# Patient Record
Sex: Male | Born: 1990 | Race: Black or African American | Hispanic: No | Marital: Single | Smoking: Never smoker
Health system: Southern US, Community
[De-identification: ages and names within clinical notes are randomized; demographics above are authoritative.]

---

## 2016-09-23 ENCOUNTER — Emergency Department (HOSPITAL_COMMUNITY)
Admission: EM | Admit: 2016-09-23 | Discharge: 2016-09-23 | Disposition: A | Discharge status: Home / Self Care | Payer: Self-pay | Attending: Emergency Medicine | Admitting: Emergency Medicine

## 2016-09-23 ENCOUNTER — Encounter (HOSPITAL_COMMUNITY): Payer: Self-pay | Admitting: *Deleted

## 2016-09-23 ENCOUNTER — Emergency Department (HOSPITAL_COMMUNITY): Payer: Self-pay

## 2016-09-23 DIAGNOSIS — S6991XA Unspecified injury of right wrist, hand and finger(s), initial encounter: Secondary | ICD-10-CM | POA: Insufficient documentation

## 2016-09-23 DIAGNOSIS — W231XXA Caught, crushed, jammed, or pinched between stationary objects, initial encounter: Secondary | ICD-10-CM | POA: Insufficient documentation

## 2016-09-23 DIAGNOSIS — Y939 Activity, unspecified: Secondary | ICD-10-CM | POA: Insufficient documentation

## 2016-09-23 DIAGNOSIS — Y999 Unspecified external cause status: Secondary | ICD-10-CM | POA: Insufficient documentation

## 2016-09-23 DIAGNOSIS — Y929 Unspecified place or not applicable: Secondary | ICD-10-CM | POA: Insufficient documentation

## 2016-09-23 MED ORDER — IBUPROFEN 400 MG PO TABS
600.0000 mg | ORAL_TABLET | Freq: Once | ORAL | Status: AC
  Administered 2016-09-23: 15:00:00 600 mg via ORAL
  Filled 2016-09-23: qty 1

## 2016-09-23 MED ORDER — IBUPROFEN 600 MG PO TABS: 600.0000 mg | ORAL_TABLET | Freq: Four times a day (QID) | ORAL | 0 refills | Status: AC | PRN

## 2016-09-23 NOTE — ED Notes (Signed)
Gone to xray  

## 2016-09-23 NOTE — ED Triage Notes (Signed)
Pt reports slamming right middle finger in car door on Monday and still having pain.

## 2016-09-23 NOTE — ED Notes (Signed)
rETURNED FROM XRAY.

## 2016-09-23 NOTE — ED Provider Notes (Signed)
[facility]-EMERGENCY DEPT Provider Note    By signing my name below, I, Earmon Phoenix, attest that this documentation has been prepared under the direction and in the presence of Melburn Hake, PA-C. Electronically Signed: Earmon Phoenix, ED Scribe. 09/23/16. 3:36 PM.    History   Chief Complaint Chief Complaint  Patient presents with  . Finger Injury    The history is provided by the patient and medical records. No language interpreter was used.    Francisco Sims is a 26 y.o. male who presents to the Emergency Department complaining of a right middle finger injury that occurred three days ago. He reports associated swelling that has been improving. He states he accidentally slammed the distal tip of hid middle finger in the car door. He has taken Tylenol and Advil for pain with temporary relief. Moving or bending the finger increases the pain. He denies alleviating factors. He denies numbness, tingling or weakness of the right third digit or right hand, fever, chills, right wrist or hand pain. He denies any previous injury or surgery to that finger.   History reviewed. No pertinent past medical history.  There are no active problems to display for this patient.   History reviewed. No pertinent surgical history.     Home Medications    Prior to Admission medications   Medication Sig Start Date End Date Taking? Authorizing Provider  ibuprofen (ADVIL,MOTRIN) 600 MG tablet Take 1 tablet (600 mg total) by mouth every 6 (six) hours as needed. 09/23/16   Barrett Henle, PA-C    Family History History reviewed. No pertinent family history.  Social History Social History  Substance Use Topics  . Smoking status: Never Smoker  . Smokeless tobacco: Not on file  . Alcohol use Yes     Comment: occ     Allergies   Patient has no known allergies.   Review of Systems Review of Systems  Musculoskeletal: Positive for arthralgias and joint swelling.  Neurological:  Negative for weakness and numbness.     Physical Exam Updated Vital Signs BP (!) 153/96 (BP Location: Right Arm)   Pulse 77   Temp 98.1 F (36.7 C) (Oral)   Resp 18   SpO2 100%   Physical Exam  Constitutional: He is oriented to person, place, and time. He appears well-developed and well-nourished.  HENT:  Head: Normocephalic and atraumatic.  Eyes: Conjunctivae and EOM are normal. Right eye exhibits no discharge. Left eye exhibits no discharge. No scleral icterus.  Neck: Normal range of motion. Neck supple.  Cardiovascular: Normal rate and intact distal pulses.   Pulmonary/Chest: Effort normal.  Musculoskeletal: He exhibits tenderness. He exhibits no deformity.  Tenderness to palpation over right middle finger with decreased active ROM of the  MCP, PIP and DIP joints; full passive ROM. Small abrasion present to volar aspect of right distal phalanx near nail bed. Nail grossly intact. Sensation grossly intact. Full ROM of remaining digits and right wrist and forearm. Radial pulse 2+.  Neurological: He is alert and oriented to person, place, and time.  Skin: Skin is warm and dry. Capillary refill takes less than 2 seconds.  Nursing note and vitals reviewed.    ED Treatments / Results  DIAGNOSTIC STUDIES: Oxygen Saturation is 100% on RA, normal by my interpretation.   COORDINATION OF CARE: 2:37 PM- Will X-Ray right hand. Will order Ibuprofen and ice pack prior to imaging. Pt verbalizes understanding and agrees to plan.  Medications  ibuprofen (ADVIL,MOTRIN) tablet 600 mg (600 mg  Oral Given 09/23/16 1525)    Labs (all labs ordered are listed, but only abnormal results are displayed) Labs Reviewed - No data to display  EKG  EKG Interpretation None       Radiology Dg Hand Complete Right  Result Date: 09/23/2016 CLINICAL DATA:  Slammed right middle finger in car door x Monday, DIP swelling, pt shielded EXAM: RIGHT HAND - COMPLETE 3+ VIEW COMPARISON:  None. FINDINGS: No  fracture or dislocation of the hand. Particular attention directed to third digit. IMPRESSION: No fracture or dislocation. Electronically Signed   By: Genevive Bi M.D.   On: 09/23/2016 15:31    Procedures Procedures (including critical care time)  Medications Ordered in ED Medications  ibuprofen (ADVIL,MOTRIN) tablet 600 mg (600 mg Oral Given 09/23/16 1525)     Initial Impression / Assessment and Plan / ED Course  I have reviewed the triage vital signs and the nursing notes.  Pertinent labs & imaging results that were available during my care of the patient were reviewed by me and considered in my medical decision making (see chart for details).     Patient X-Ray negative for obvious fracture or dislocation. Pain managed in ED. Suspect finger contusion vs finger sprain. Pt advised to follow up with hand surgeon if symptoms persist. Patient advised to continue wearing finger brace for the next week, conservative therapy recommended and discussed. Patient will be dc home & is agreeable with above plan.   Final Clinical Impressions(s) / ED Diagnoses   Final diagnoses:  Injury of finger of right hand, initial encounter    New Prescriptions New Prescriptions   IBUPROFEN (ADVIL,MOTRIN) 600 MG TABLET    Take 1 tablet (600 mg total) by mouth every 6 (six) hours as needed.    I personally performed the services described in this documentation, which was scribed in my presence. The recorded information has been reviewed and is accurate.     Satira Sark [place], [place] 09/23/16 1546    Linwood Dibbles, MD 09/23/16 2105

## 2016-09-23 NOTE — Discharge Instructions (Signed)
Take your medication as prescribed as needed for pain and swelling. I also recommend continuing to rest, elevate and apply ice to affected finger for 15-20 minutes 3-4 times daily. He may continue wearing your finger splint daily. I recommend following up with the hand clinic listed below if your symptoms are not improved over the next week. Please return to the Emergency Department if symptoms worsen or new onset of fever, redness, swelling, numbness, weakness, decreased range of motion.

## 2017-11-15 IMAGING — DX DG HAND COMPLETE 3+V*R*
3 series · 3 of 3 positions shown · non-contrast
Comparison: None.

CLINICAL DATA: Slammed right middle finger in car door x [REDACTED],
DIP swelling, pt shielded

EXAM:
RIGHT HAND - COMPLETE 3+ VIEW

[x hand pa right]
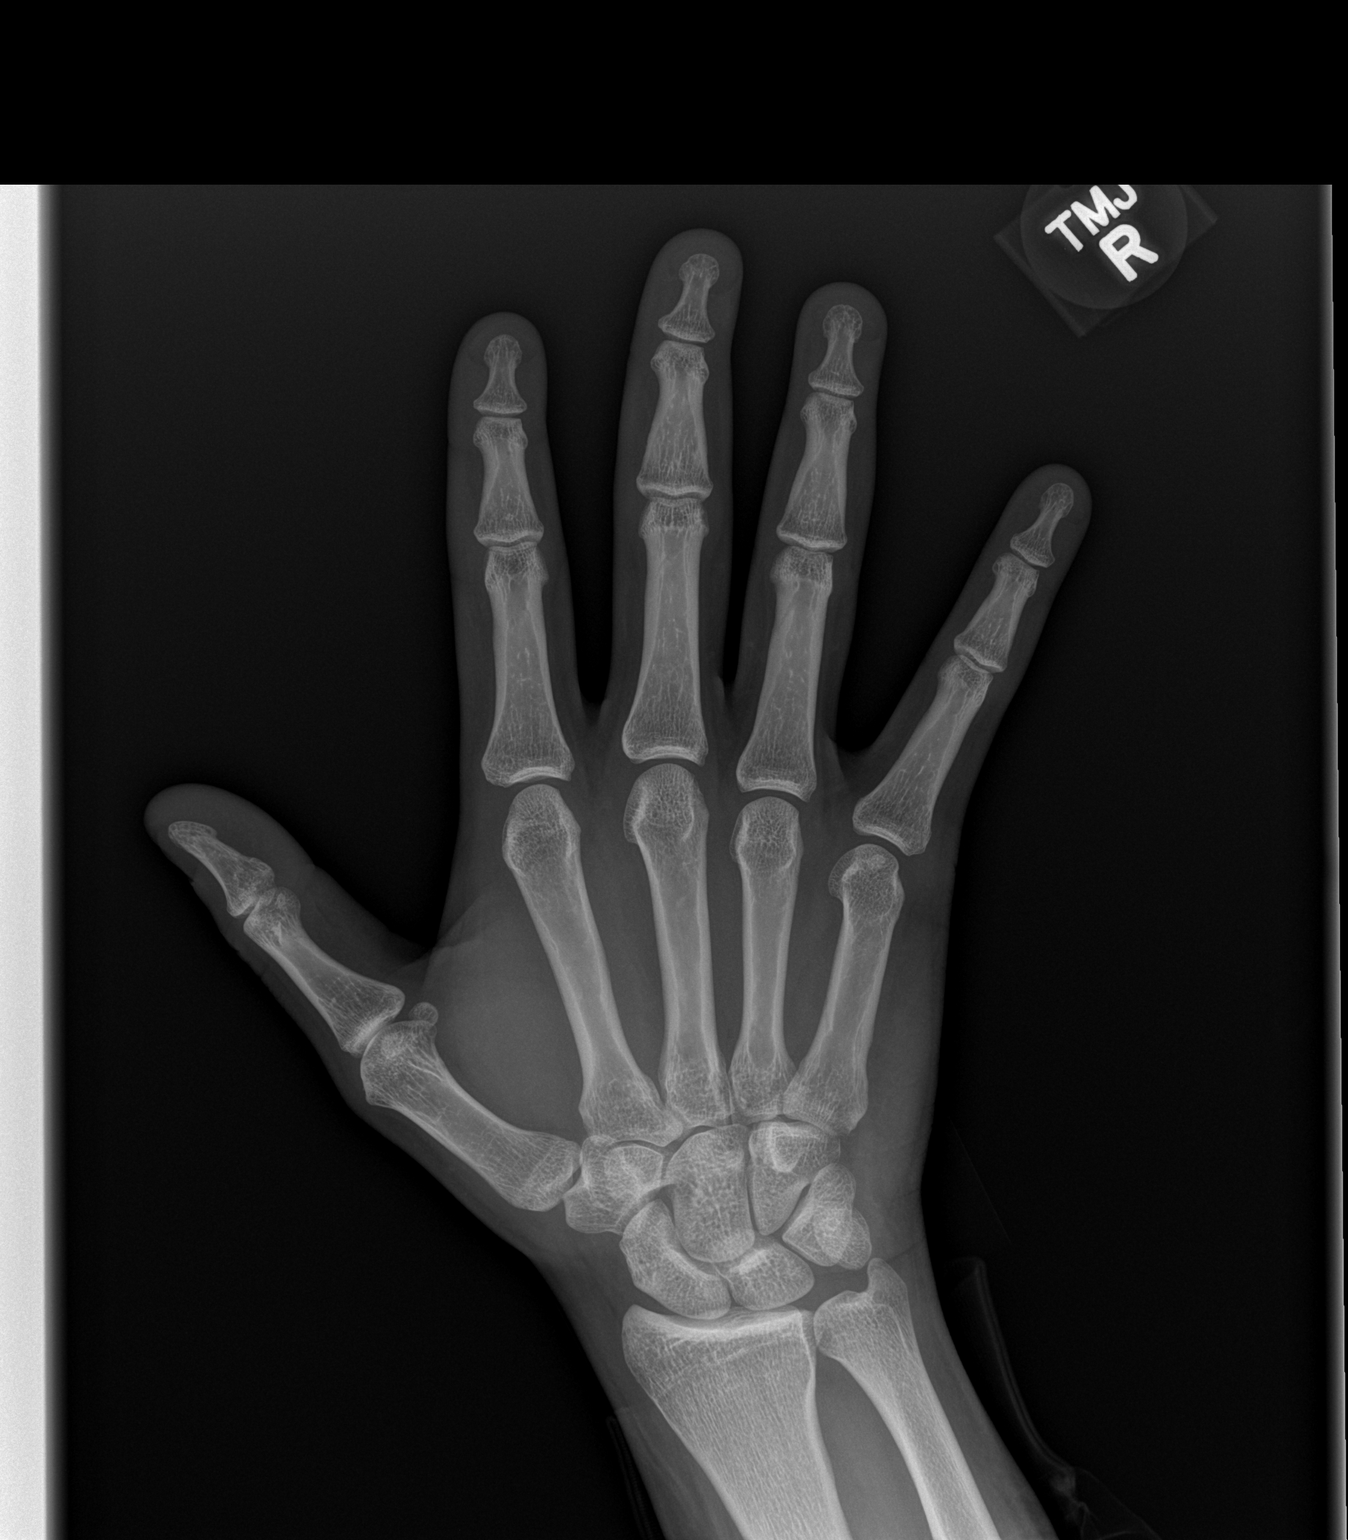

[x hand obl right]
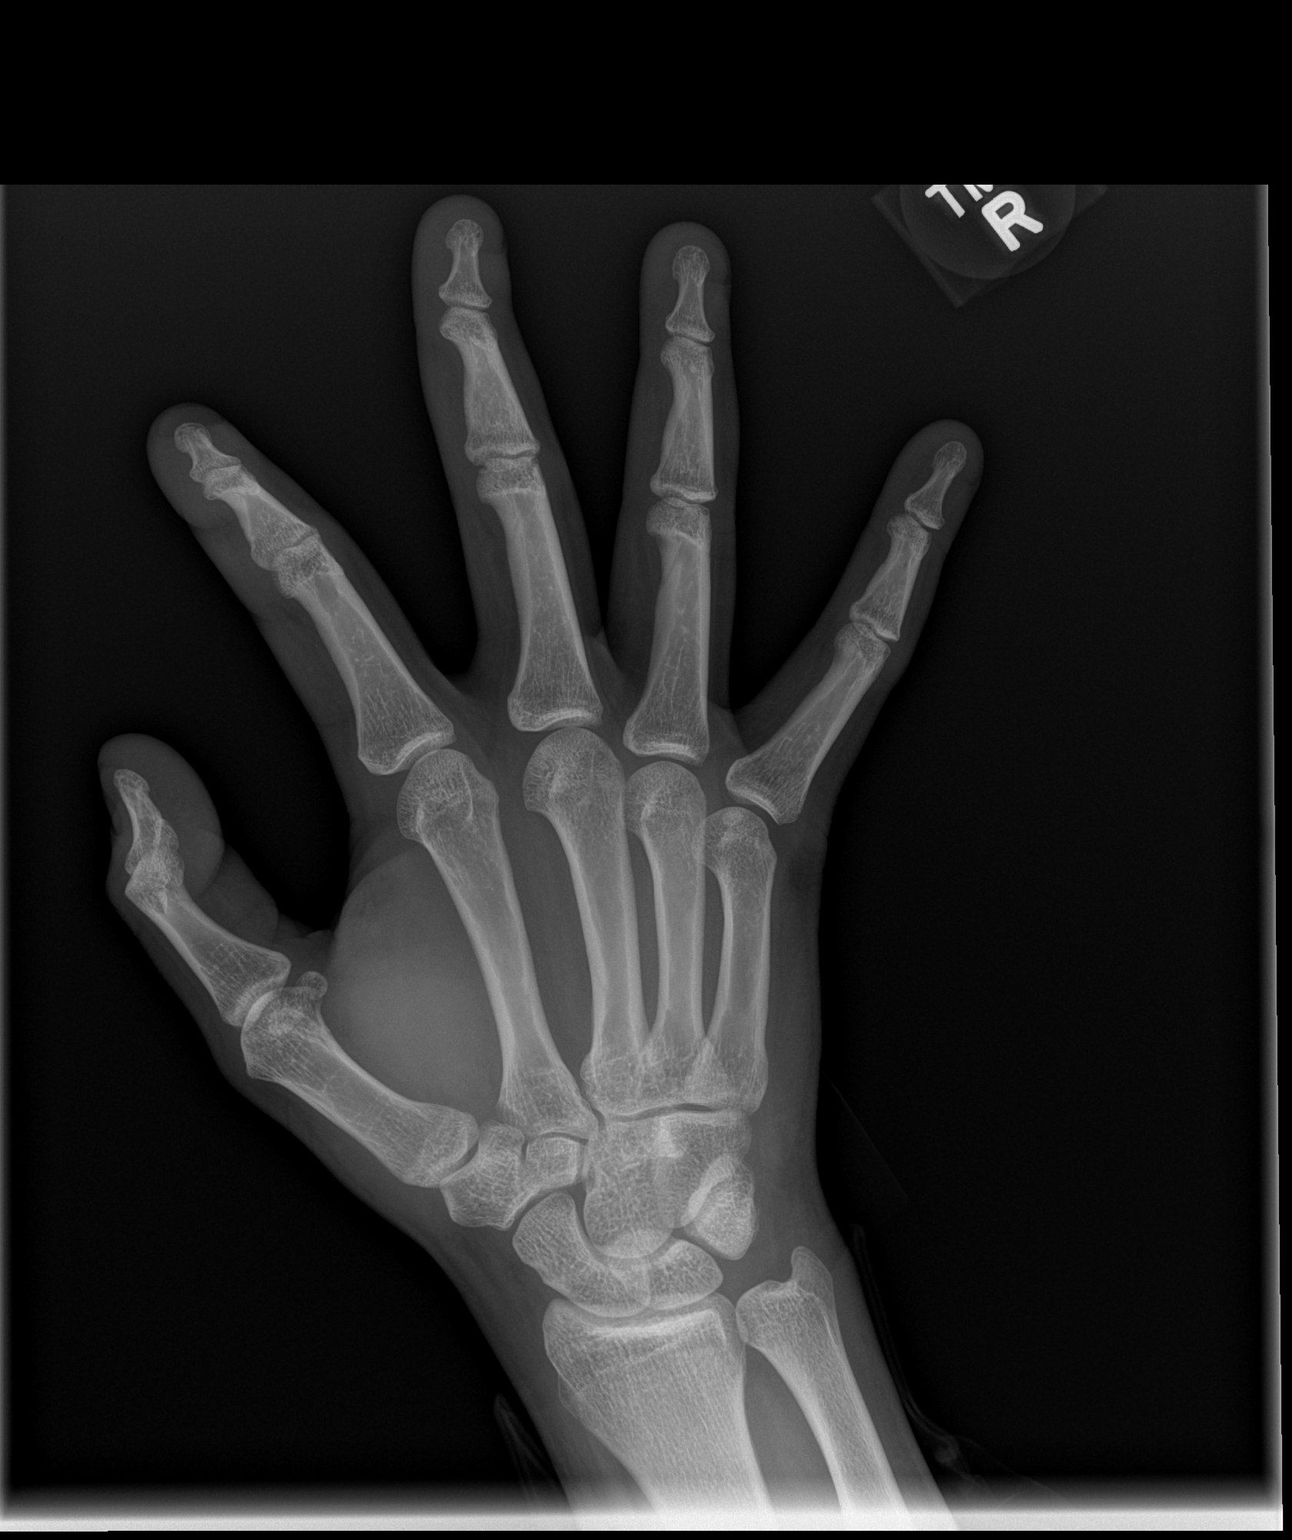

[x hand lat right]
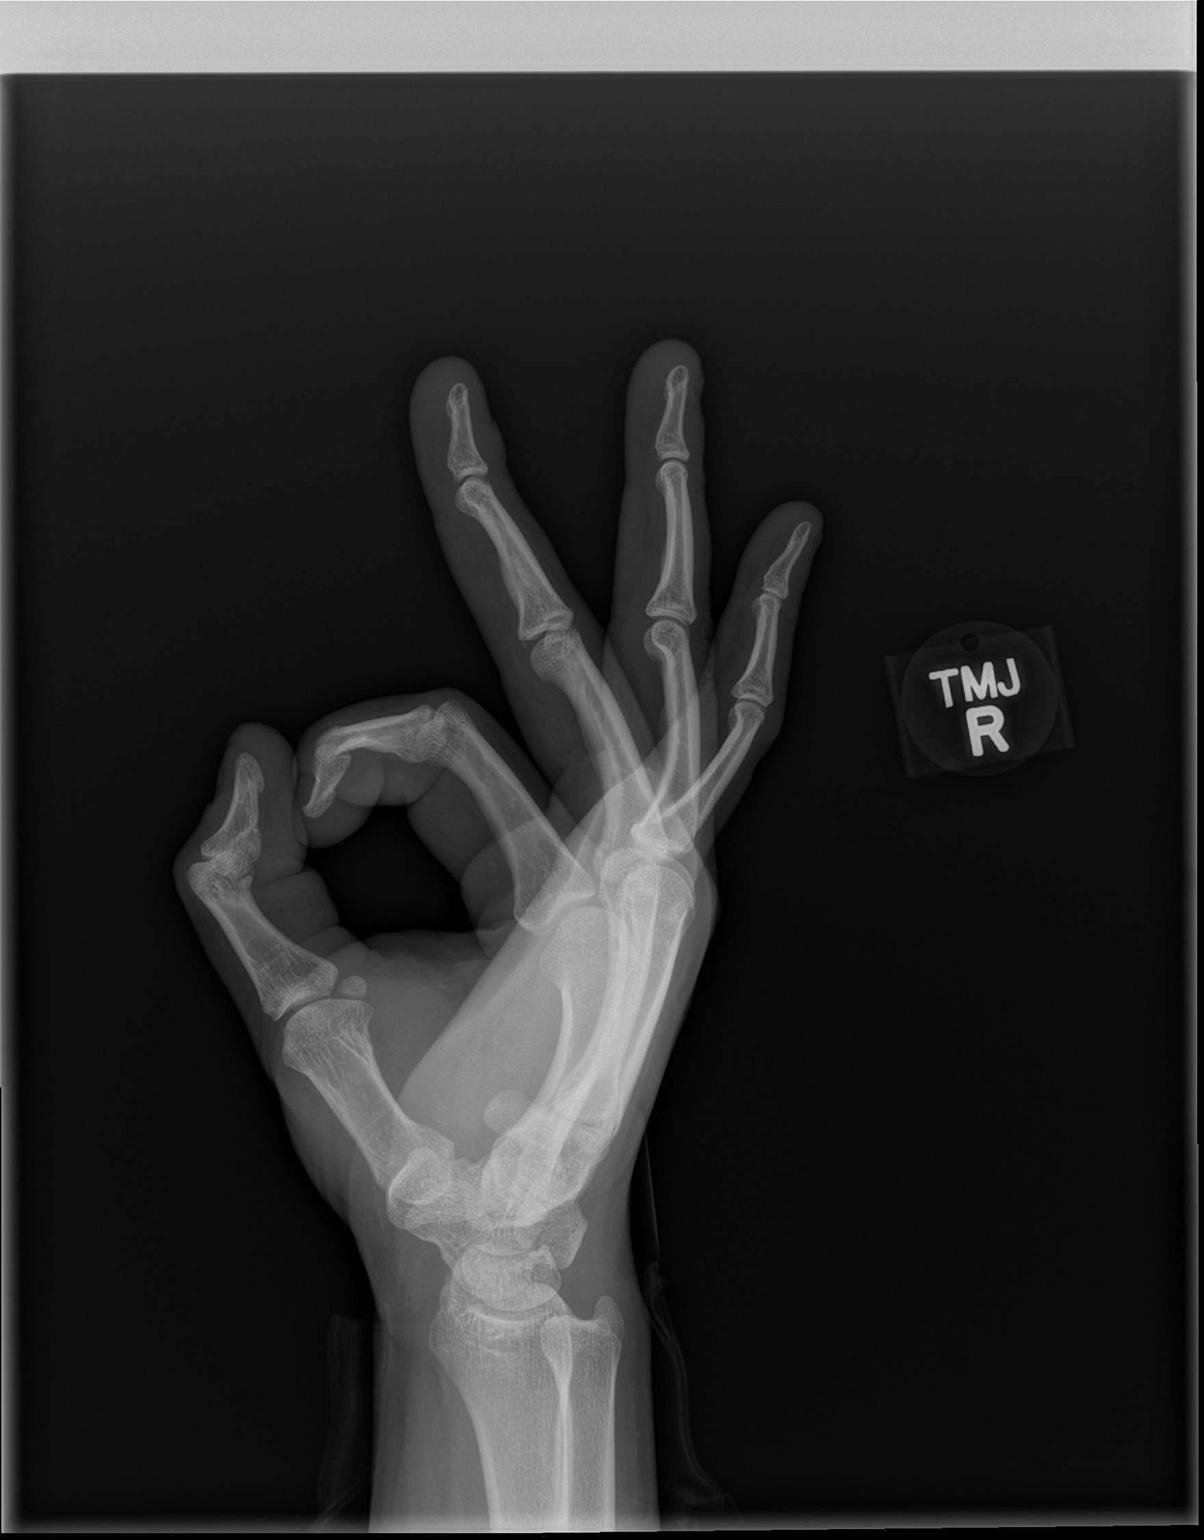

[3 of 3 positions shown; findings below may reference images not displayed]

FINDINGS: No fracture or dislocation of the hand. Particular attention
directed to third digit.
IMPRESSION: No fracture or dislocation.
